# Patient Record
Sex: Female | Born: 2000
Health system: Southern US, Community
[De-identification: ages and names within clinical notes are randomized; demographics above are authoritative.]

---
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
## 2019-01-15 DIAGNOSIS — R1011 Right upper quadrant pain: Secondary | ICD-10-CM | POA: Diagnosis not present

## 2019-01-15 DIAGNOSIS — R102 Pelvic and perineal pain: Secondary | ICD-10-CM | POA: Diagnosis not present

## 2019-01-15 DIAGNOSIS — R635 Abnormal weight gain: Secondary | ICD-10-CM | POA: Diagnosis not present

## 2019-01-16 DIAGNOSIS — Z30431 Encounter for routine checking of intrauterine contraceptive device: Secondary | ICD-10-CM | POA: Diagnosis not present

## 2019-01-16 DIAGNOSIS — R102 Pelvic and perineal pain: Secondary | ICD-10-CM | POA: Diagnosis not present

## 2019-01-29 DIAGNOSIS — R1011 Right upper quadrant pain: Secondary | ICD-10-CM | POA: Diagnosis not present

## 2019-02-03 DIAGNOSIS — M549 Dorsalgia, unspecified: Secondary | ICD-10-CM | POA: Diagnosis not present

## 2019-02-05 DIAGNOSIS — M549 Dorsalgia, unspecified: Secondary | ICD-10-CM | POA: Diagnosis not present

## 2019-02-10 ENCOUNTER — Other Ambulatory Visit: Payer: Self-pay | Admitting: Obstetrics and Gynecology

## 2019-02-10 DIAGNOSIS — R1011 Right upper quadrant pain: Secondary | ICD-10-CM

## 2019-02-24 DIAGNOSIS — R300 Dysuria: Secondary | ICD-10-CM | POA: Diagnosis not present

## 2019-03-05 DIAGNOSIS — N390 Urinary tract infection, site not specified: Secondary | ICD-10-CM | POA: Diagnosis not present

## 2019-04-02 DIAGNOSIS — F4323 Adjustment disorder with mixed anxiety and depressed mood: Secondary | ICD-10-CM | POA: Diagnosis not present

## 2019-04-02 DIAGNOSIS — F411 Generalized anxiety disorder: Secondary | ICD-10-CM | POA: Diagnosis not present

## 2019-04-02 DIAGNOSIS — F481 Depersonalization-derealization syndrome: Secondary | ICD-10-CM | POA: Diagnosis not present

## 2019-04-02 DIAGNOSIS — F439 Reaction to severe stress, unspecified: Secondary | ICD-10-CM | POA: Diagnosis not present

## 2019-05-30 ENCOUNTER — Other Ambulatory Visit: Payer: Self-pay

## 2019-05-30 ENCOUNTER — Encounter: Payer: Self-pay | Admitting: Family Medicine

## 2019-05-30 ENCOUNTER — Ambulatory Visit: Payer: Self-pay

## 2019-05-30 ENCOUNTER — Ambulatory Visit (INDEPENDENT_AMBULATORY_CARE_PROVIDER_SITE_OTHER): Payer: BC Managed Care – PPO | Admitting: Family Medicine

## 2019-05-30 DIAGNOSIS — M5441 Lumbago with sciatica, right side: Secondary | ICD-10-CM

## 2019-05-30 DIAGNOSIS — G8929 Other chronic pain: Secondary | ICD-10-CM

## 2019-05-30 MED ORDER — MELOXICAM 15 MG PO TABS: 7.5000 mg | ORAL_TABLET | Freq: Every day | ORAL | 6 refills | Status: AC | PRN

## 2019-05-30 MED ORDER — BACLOFEN 10 MG PO TABS: 5.0000 mg | ORAL_TABLET | Freq: Every evening | ORAL | 3 refills | Status: AC | PRN

## 2019-05-30 NOTE — Patient Instructions (Signed)
   Right Sacroiliac Joint Dysfunction

## 2019-05-30 NOTE — Progress Notes (Signed)
Lower back pain Right leg numbness No injury PT in the past hasnt helped Ibuprofen helps

## 2019-05-30 NOTE — Progress Notes (Signed)
   Office Visit Note   Patient: Sherri Quinn           Date of Birth: 08/25/2000           MRN: [id number] Visit Date: 05/30/2019 Requested by: Georgann Housekeeper, MD [address] [place],  [place] [address] PCP: Georgann Housekeeper, MD  Subjective: Chief Complaint  Patient presents with  . Lower Back - Pain    HPI: She is here with chronic low back pain.  Symptoms for several years, no definite injury.  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  She would frequently have to lift boxes of books and usually did not use proper lifting techniques.  She never remembered a specific injury but she has had right-sided lower back pain intermittently since then.  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  She has to sit a lot, and that seems to bother things as well.  Pain occasionally radiates down the back of the right leg to the knee but never beyond.  No numbness or tingling, no bowel or bladder dysfunction.  She has tried over-the-counter medicines with no significant improvement.  There is a family history of osteoarthritis, and her mother has had troubles with sciatica.              ROS:   All other systems were reviewed and are negative.  Objective: Vital Signs: There were no vitals taken for this visit.  Physical Exam:  General:  Alert and oriented, in no acute distress. Pulm:  Breathing unlabored. Psy:  Normal mood, congruent affect. Skin: No visible rash Low back: Her leg lengths are slightly unequal with the left 1 longer than the right.  She has tenderness directly over the right SI joint and a positive stork test.  Negative stork test on the left.  Straight leg raise negative, lower extremity strength and reflexes are normal.  Good range of motion of her hips.  Imaging: XR Lumbar Spine 2-3 Views  Result Date: 05/30/2019 X-rays lumbar spine: Very slight narrowing of the L5-S1 disc space.  SI joints look  unremarkable, no sclerotic change.  No sign of compression deformity or neoplasm.  Hip joints look normal.   Assessment & Plan: 1.  Chronic low back pain, question sacroiliac dysfunction.  Cannot rule out lumbar disc protrusion. -We will treat her leg length discrepancy with pain 5/16 inch heel pad. -Referral to either Dr. Barron Alvine for chiropractic treatment versus [facility]. -Prescription for meloxicam and baclofen to take as needed. -Lumbar MRI scan if symptoms persist.     Procedures: No procedures performed  No notes on file     PMFS History: There are no problems to display for this patient.  History reviewed. No pertinent past medical history.  Family History  Problem Relation Age of Onset  . Anemia Mother   . Hypertension Father   . Hyperlipidemia Father   . Skin cancer Father   . Diabetes Father     History reviewed. No pertinent surgical history. Social History   Occupational History  . Not on file  Tobacco Use  . Smoking status: Never Smoker  . Smokeless tobacco: Never Used  Substance and Sexual Activity  . Alcohol use: Yes    Comment: occasionally  . Drug use: Never  . Sexual activity: Not on file

## 2019-07-22 DIAGNOSIS — Z6833 Body mass index (BMI) 33.0-33.9, adult: Secondary | ICD-10-CM | POA: Diagnosis not present

## 2019-07-22 DIAGNOSIS — Z01419 Encounter for gynecological examination (general) (routine) without abnormal findings: Secondary | ICD-10-CM | POA: Diagnosis not present

## 2019-07-22 DIAGNOSIS — Z3040 Encounter for surveillance of contraceptives, unspecified: Secondary | ICD-10-CM | POA: Diagnosis not present

## 2019-08-26 ENCOUNTER — Ambulatory Visit (HOSPITAL_COMMUNITY)
Admission: RE | Admit: 2019-08-26 | Discharge: 2019-08-26 | Disposition: A | Discharge status: Home / Self Care | Payer: BC Managed Care – PPO | Source: Ambulatory Visit | Attending: Family Medicine | Admitting: Family Medicine

## 2019-08-26 ENCOUNTER — Other Ambulatory Visit: Payer: Self-pay

## 2019-08-26 ENCOUNTER — Encounter (HOSPITAL_COMMUNITY): Payer: Self-pay

## 2019-08-26 VITALS — BP 144/91 | HR 95 | Temp 98.6°F | Resp 15

## 2019-08-26 DIAGNOSIS — N390 Urinary tract infection, site not specified: Secondary | ICD-10-CM

## 2019-08-26 DIAGNOSIS — N3001 Acute cystitis with hematuria: Secondary | ICD-10-CM | POA: Diagnosis not present

## 2019-08-26 DIAGNOSIS — Z3202 Encounter for pregnancy test, result negative: Secondary | ICD-10-CM | POA: Diagnosis not present

## 2019-08-26 DIAGNOSIS — H5213 Myopia, bilateral: Secondary | ICD-10-CM | POA: Diagnosis not present

## 2019-08-26 LAB — POCT URINALYSIS DIPSTICK, ED / UC
Glucose, UA: 500 mg/dL — AB
Ketones, ur: 15 mg/dL — AB
Nitrite: POSITIVE — AB
Protein, ur: >=300 mg/dL — AB
Specific Gravity, Urine: <=1.005 (ref 1.005–1.030)
Urobilinogen, UA: 4 mg/dL — ABNORMAL HIGH (ref 0.0–1.0)
pH: 5 (ref 5.0–8.0)

## 2019-08-26 LAB — POC URINE PREG, ED: Preg Test, Ur: NEGATIVE

## 2019-08-26 MED ORDER — NITROFURANTOIN MONOHYD MACRO 100 MG PO CAPS: 100.0000 mg | ORAL_CAPSULE | Freq: Two times a day (BID) | ORAL | 0 refills | Status: AC

## 2019-08-26 NOTE — Discharge Instructions (Signed)
Treating you for a urinary tract infection Take the medicine as prescribed Drink plenty of water.  Follow up as needed for continued or worsening symptoms  

## 2019-08-26 NOTE — ED Provider Notes (Signed)
[facility]    CSN: [id number] Arrival date & time: 08/26/19  1050      History   Chief Complaint Chief Complaint  Patient presents with  . Appointment  . Urinary Tract Infection    HPI Sherri Quinn is a 19 y.o. female.   Patient is a 19 year old female who presents today with urinary odor, dysuria.  Is been present since Sunday.  Has been using AZO which does not help.  Lower back pain as well.  No vaginal symptoms.  No fever, chills, flank pain, nausea or vomiting.     History reviewed. No pertinent past medical history.  There are no problems to display for this patient.   History reviewed. No pertinent surgical history.  OB History   No obstetric history on file.      Home Medications    Prior to Admission medications   Medication Sig Start Date End Date Taking? Authorizing Provider  baclofen (LIORESAL) 10 MG tablet Take 0.5-1 tablets (5-10 mg total) by mouth at bedtime as needed for muscle spasms. 05/30/19   Hilts, Casimiro Needle, MD  meloxicam (MOBIC) 15 MG tablet Take 0.5-1 tablets (7.5-15 mg total) by mouth daily as needed for pain. 05/30/19   Hilts, Casimiro Needle, MD  nitrofurantoin, macrocrystal-monohydrate, (MACROBID) 100 MG capsule Take 1 capsule (100 mg total) by mouth 2 (two) times daily. 08/26/19   Janace Aris, NP    Family History Family History  Problem Relation Age of Onset  . Anemia Mother   . Hypertension Father   . Hyperlipidemia Father   . Skin cancer Father   . Diabetes Father     Social History Social History   Tobacco Use  . Smoking status: Never Smoker  . Smokeless tobacco: Never Used  Substance Use Topics  . Alcohol use: Yes    Comment: occasionally  . Drug use: Never     Allergies   Chamomile and Cranberry   Review of Systems Review of Systems   Physical Exam Triage Vital Signs ED Triage Vitals  Enc Vitals Group     BP 08/26/19 1112 (!) 144/91     Pulse Rate 08/26/19 1112 95     Resp 08/26/19 1112 15      Temp 08/26/19 1112 98.6 F (37 C)     Temp Source 08/26/19 1112 Oral     SpO2 08/26/19 1112 98 %     Weight --      Height --      Head Circumference --      Peak Flow --      Pain Score 08/26/19 1111 0     Pain Loc --      Pain Edu? --      Excl. in [place]? --    No data found.  Updated Vital Signs BP (!) 144/91 (BP Location: Left Arm)   Pulse 95   Temp 98.6 F (37 C) (Oral)   Resp 15   SpO2 98%   Visual Acuity Right Eye Distance:   Left Eye Distance:   Bilateral Distance:    Right Eye Near:   Left Eye Near:    Bilateral Near:     Physical Exam Vitals and nursing note reviewed.  Constitutional:      General: She is not in acute distress.    Appearance: Normal appearance. She is not ill-appearing, toxic-appearing or diaphoretic.  HENT:     Head: Normocephalic.     Nose: Nose normal.  Eyes:  Conjunctiva/sclera: Conjunctivae normal.  Pulmonary:     Effort: Pulmonary effort is normal.  Musculoskeletal:        General: Normal range of motion.     Cervical back: Normal range of motion.  Skin:    General: Skin is warm and dry.     Findings: No rash.  Neurological:     Mental Status: She is alert.  Psychiatric:        Mood and Affect: Mood normal.      UC Treatments / Results  Labs (all labs ordered are listed, but only abnormal results are displayed) Labs Reviewed  POCT URINALYSIS DIPSTICK, ED / UC - Abnormal; Notable for the following components:      Result Value   Glucose, UA 500 (*)    Bilirubin Urine SMALL (*)    Ketones, ur 15 (*)    Hgb urine dipstick MODERATE (*)    Protein, ur >=300 (*)    Urobilinogen, UA 4.0 (*)    Nitrite POSITIVE (*)    Leukocytes,Ua LARGE (*)    All other components within normal limits  URINE CULTURE  POC URINE PREG, ED    EKG   Radiology No results found.  Procedures Procedures (including critical care time)  Medications Ordered in UC Medications - No data to display  Initial Impression / Assessment and  Plan / UC Course  I have reviewed the triage vital signs and the nursing notes.  Pertinent labs & imaging results that were available during my care of the patient were reviewed by me and considered in my medical decision making (see chart for details).     Acute cystitis Treating with Macrobid Push fluids Follow up as needed for continued or worsening symptoms  Final Clinical Impressions(s) / UC Diagnoses   Final diagnoses:  Acute cystitis with hematuria     Discharge Instructions     Treating you for a urinary tract infection Take the medicine as prescribed Drink plenty of water.  Follow up as needed for continued or worsening symptoms     ED Prescriptions    Medication Sig Dispense Auth. Provider   nitrofurantoin, macrocrystal-monohydrate, (MACROBID) 100 MG capsule Take 1 capsule (100 mg total) by mouth 2 (two) times daily. 10 capsule Dahlia Byes A, NP     PDMP not reviewed this encounter.   Janace Aris, NP 08/26/19 1255

## 2019-08-26 NOTE — ED Triage Notes (Signed)
Pt c/o odor and burning with urination onset Sunday. Pt states she brought and off brand AZO which has not helped. She c/o lower back pain as well.

## 2019-08-28 LAB — URINE CULTURE: Culture: <10000 — AB

## 2020-02-26 DIAGNOSIS — F609 Personality disorder, unspecified: Secondary | ICD-10-CM | POA: Diagnosis not present

## 2020-03-18 DIAGNOSIS — F609 Personality disorder, unspecified: Secondary | ICD-10-CM | POA: Diagnosis not present

## 2020-04-01 DIAGNOSIS — F609 Personality disorder, unspecified: Secondary | ICD-10-CM | POA: Diagnosis not present

## 2020-04-18 IMAGING — US ULTRASOUND ABDOMEN COMPLETE
1 series · 14 of 25 positions shown · non-contrast
Comparison: None.

CLINICAL DATA: Abdominal pain

EXAM:
ABDOMEN ULTRASOUND COMPLETE

[Series 1: ultrasound abdomen complete · 0.19mm/px · 14 of 76 slices shown]
[im 1/76]
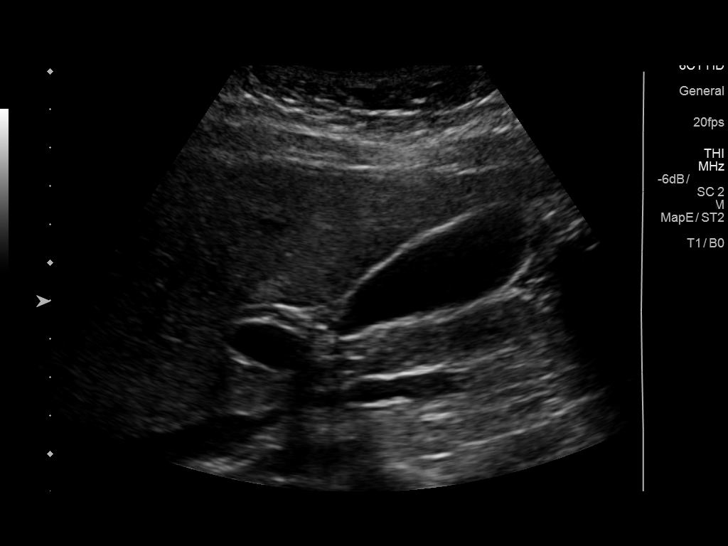
[im 7/76]
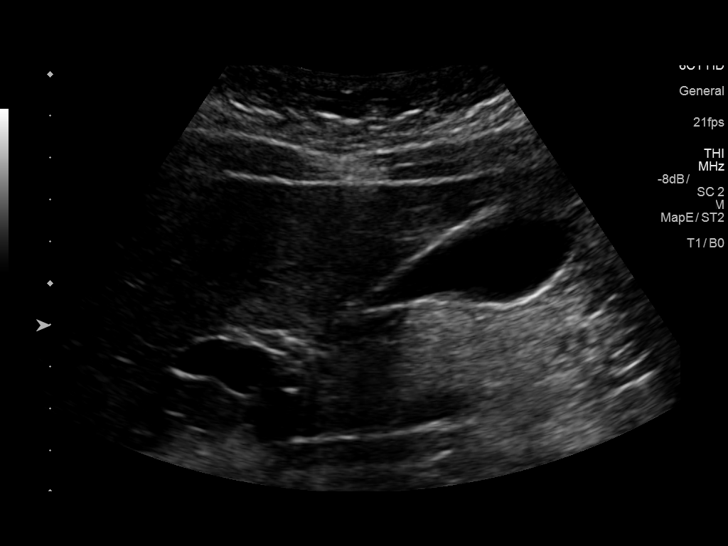
[im 13/76]
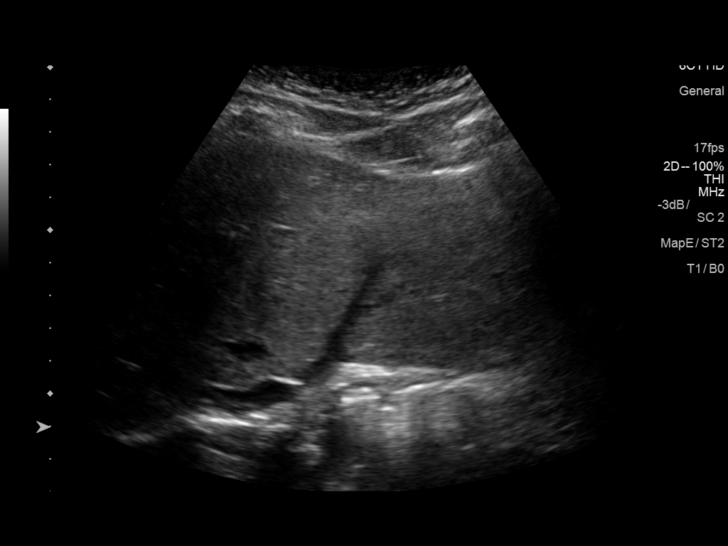
[im 19/76]
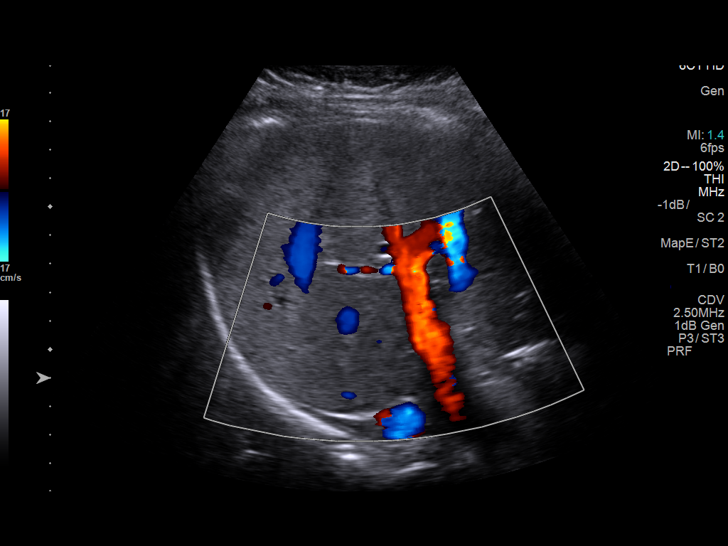
[im 26/76]
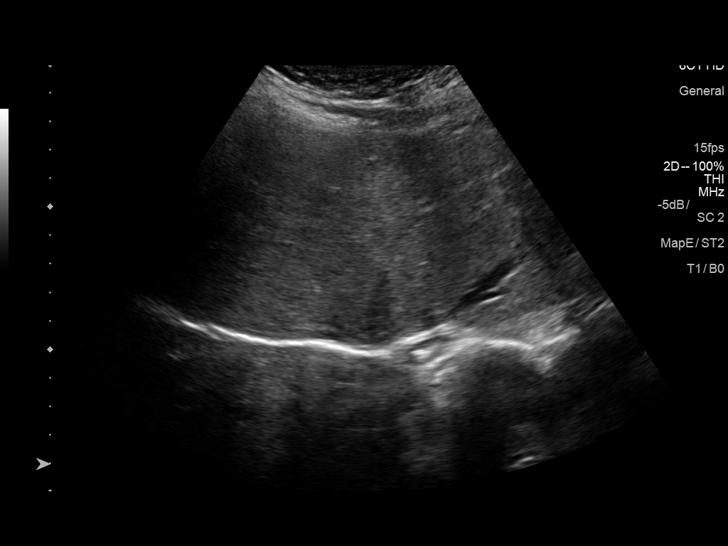
[im 29/76]
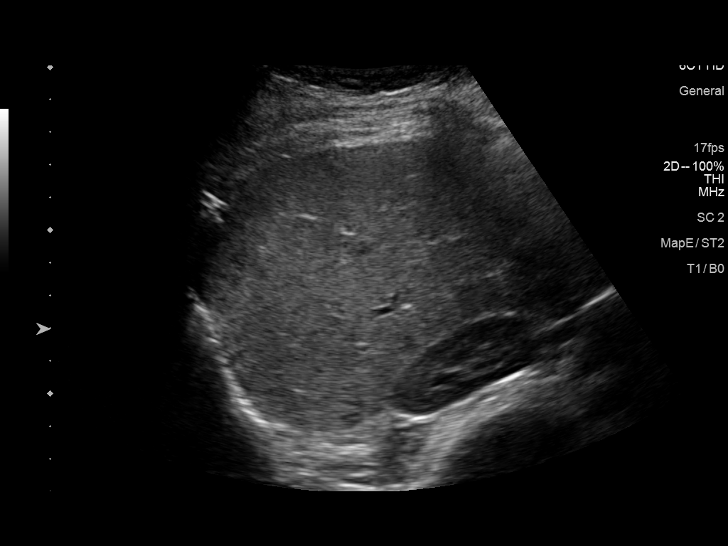
[im 35/76]
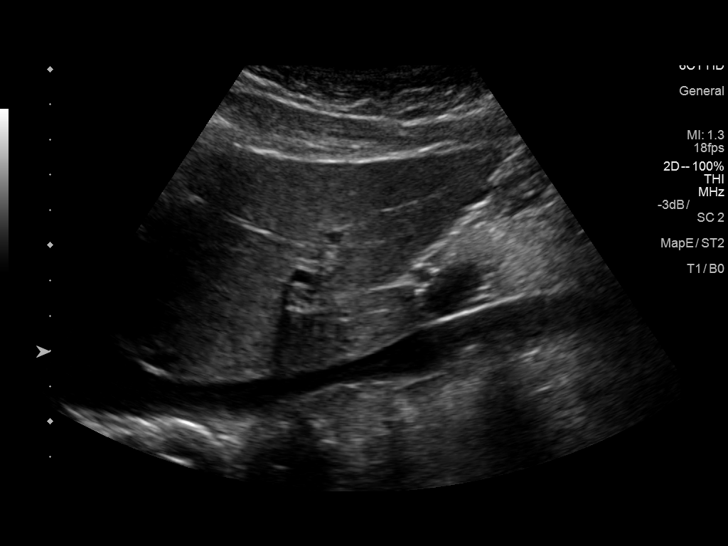
[im 41/76]
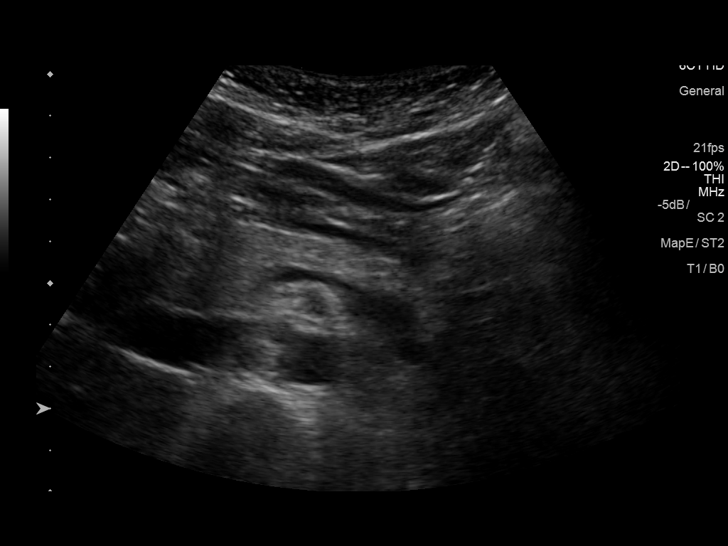
[im 47/76]
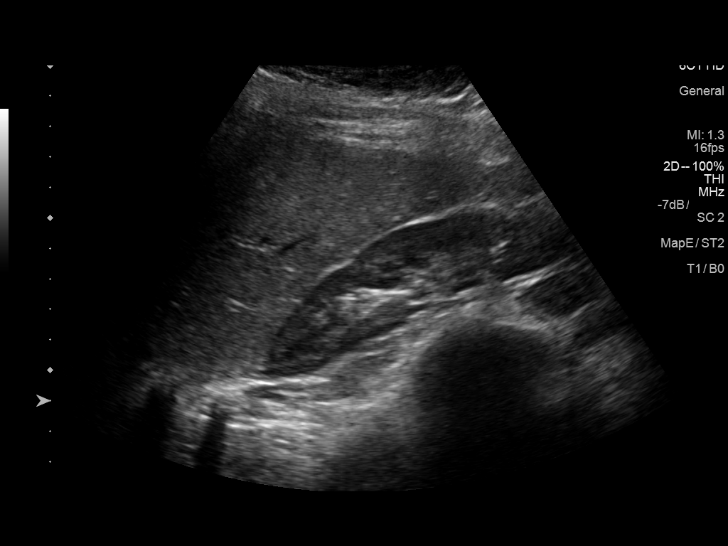
[im 51/76]
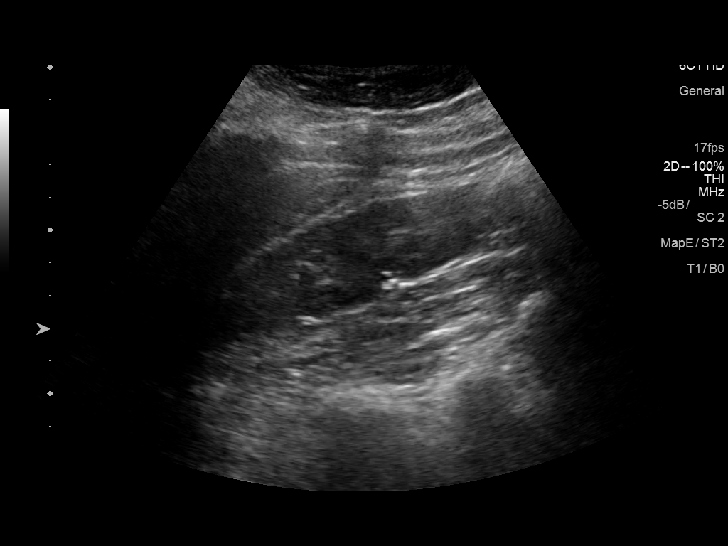
[im 57/76]
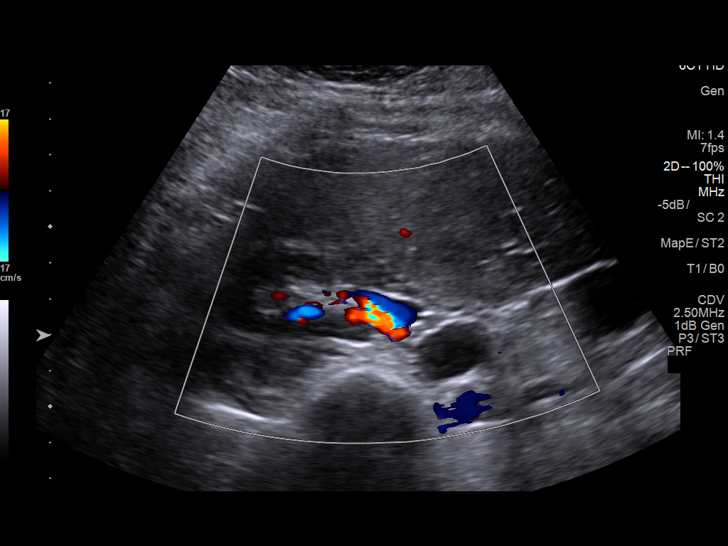
[im 63/76]
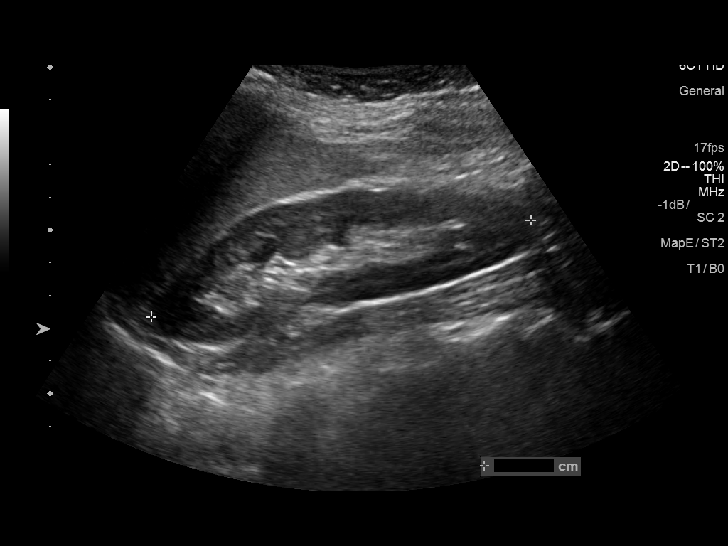
[im 69/76]
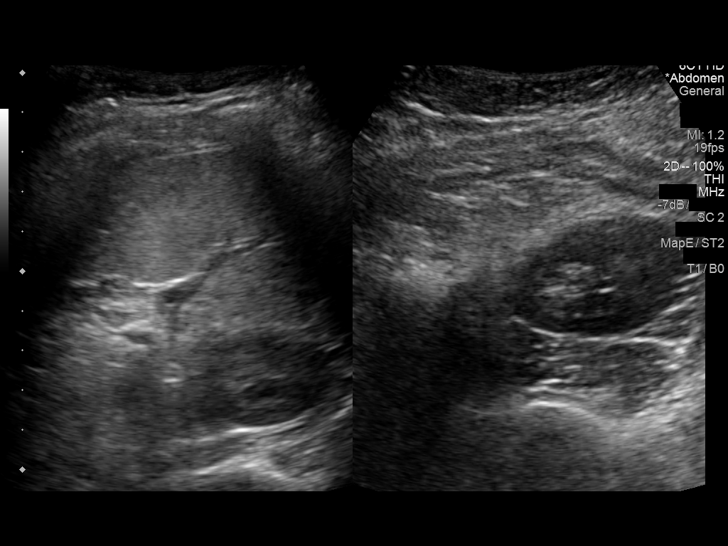
[im 76/76]
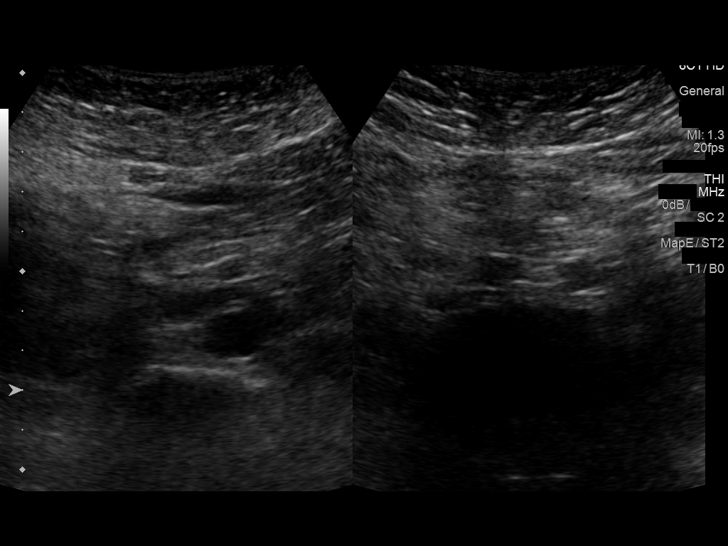

[14 of 25 positions shown; findings below may reference images not displayed]

FINDINGS: Gallbladder: No gallstones or wall thickening visualized. There is
no pericholecystic fluid. No sonographic Murphy sign noted by
sonographer.

Common bile duct: Diameter: 3 mm. No intrahepatic, common hepatic,
or common bile duct dilatation.

Liver: No focal lesion identified. Within normal limits in
parenchymal echogenicity. Portal vein is patent on color Doppler
imaging with normal direction of blood flow towards the liver.

IVC: No abnormality visualized.

Pancreas: No pancreatic mass or inflammatory focus.

Spleen: Size and appearance within normal limits.

Right Kidney: Length: 11.8 cm. Echogenicity within normal limits. No
mass or hydronephrosis visualized.

Left Kidney: Length: 12.0 cm. Echogenicity within normal limits. No
mass or hydronephrosis visualized.

Abdominal aorta: No aneurysm visualized.

Other findings: No demonstrable ascites.
IMPRESSION: Study within normal limits.

## 2020-04-19 DIAGNOSIS — F609 Personality disorder, unspecified: Secondary | ICD-10-CM | POA: Diagnosis not present
